# Patient Record
Sex: Female | Born: 1990 | Race: White | Hispanic: No | Marital: Single
Health system: Southern US, Community
[De-identification: ages and names within clinical notes are randomized; demographics above are authoritative.]

## PROBLEM LIST (undated history)

## (undated) DIAGNOSIS — K529 Noninfective gastroenteritis and colitis, unspecified: Secondary | ICD-10-CM

## (undated) DIAGNOSIS — G43909 Migraine, unspecified, not intractable, without status migrainosus: Secondary | ICD-10-CM

---

## 2014-09-08 ENCOUNTER — Emergency Department: Payer: Self-pay | Admitting: Emergency Medicine

## 2014-09-09 ENCOUNTER — Emergency Department: Payer: Self-pay | Admitting: Emergency Medicine

## 2014-11-13 ENCOUNTER — Emergency Department: Admit: 2014-11-13 | Discharge status: Against Medical Advice | Payer: Self-pay | Admitting: Emergency Medicine

## 2014-11-13 LAB — CBC WITH DIFFERENTIAL/PLATELET
BASOS ABS: 0 10*3/uL (ref 0.0–0.1)
BASOS PCT: 0.3 %
Eosinophil #: 0.1 10*3/uL (ref 0.0–0.7)
Eosinophil %: 1.7 %
HCT: 38.2 % (ref 35.0–47.0)
HGB: 12.5 g/dL (ref 12.0–16.0)
LYMPHS PCT: 26.4 %
Lymphocyte #: 2 10*3/uL (ref 1.0–3.6)
MCH: 29.9 pg (ref 26.0–34.0)
MCHC: 32.7 g/dL (ref 32.0–36.0)
MCV: 91 fL (ref 80–100)
MONO ABS: 0.4 x10 3/mm (ref 0.2–0.9)
Monocyte %: 4.9 %
Neutrophil #: 5.1 10*3/uL (ref 1.4–6.5)
Neutrophil %: 66.7 %
PLATELETS: 202 10*3/uL (ref 150–440)
RBC: 4.18 10*6/uL (ref 3.80–5.20)
RDW: 13.7 % (ref 11.5–14.5)
WBC: 7.7 10*3/uL (ref 3.6–11.0)

## 2014-11-13 LAB — URINALYSIS, COMPLETE
BILIRUBIN, UR: NEGATIVE
Bacteria: NONE SEEN
Blood: NEGATIVE
Glucose,UR: NEGATIVE mg/dL (ref 0–75)
LEUKOCYTE ESTERASE: NEGATIVE
Nitrite: NEGATIVE
PROTEIN: NEGATIVE
Ph: 6 (ref 4.5–8.0)
RBC, UR: NONE SEEN /HPF (ref 0–5)
Specific Gravity: 1.015 (ref 1.003–1.030)
Squamous Epithelial: NONE SEEN

## 2014-11-13 LAB — COMPREHENSIVE METABOLIC PANEL
ANION GAP: 11 (ref 7–16)
Albumin: 5.3 g/dL — ABNORMAL HIGH
Alkaline Phosphatase: 50 U/L
BUN: 11 mg/dL
Bilirubin,Total: 1 mg/dL
CALCIUM: 9.4 mg/dL
CO2: 23 mmol/L
Chloride: 104 mmol/L
Creatinine: 0.73 mg/dL
EGFR (African American): >60
EGFR (Non-African Amer.): >60
Glucose: 87 mg/dL
Potassium: 3.8 mmol/L
SGOT(AST): 32 U/L
SGPT (ALT): 15 U/L
SODIUM: 138 mmol/L
Total Protein: 8 g/dL

## 2014-12-14 ENCOUNTER — Emergency Department
Admission: EM | Admit: 2014-12-14 | Discharge: 2014-12-14 | Disposition: A | Discharge status: Home / Self Care | Payer: Managed Care, Other (non HMO) | Attending: Emergency Medicine | Admitting: Emergency Medicine

## 2014-12-14 ENCOUNTER — Encounter: Payer: Self-pay | Admitting: Emergency Medicine

## 2014-12-14 ENCOUNTER — Emergency Department: Payer: Managed Care, Other (non HMO)

## 2014-12-14 DIAGNOSIS — Z3202 Encounter for pregnancy test, result negative: Secondary | ICD-10-CM | POA: Insufficient documentation

## 2014-12-14 DIAGNOSIS — R51 Headache: Secondary | ICD-10-CM

## 2014-12-14 DIAGNOSIS — G43901 Migraine, unspecified, not intractable, with status migrainosus: Secondary | ICD-10-CM | POA: Diagnosis not present

## 2014-12-14 DIAGNOSIS — R519 Headache, unspecified: Secondary | ICD-10-CM

## 2014-12-14 HISTORY — DX: Migraine, unspecified, not intractable, without status migrainosus: G43.909

## 2014-12-14 HISTORY — DX: Noninfective gastroenteritis and colitis, unspecified: K52.9

## 2014-12-14 LAB — POCT PREGNANCY, URINE: Preg Test, Ur: NEGATIVE

## 2014-12-14 MED ORDER — PROMETHAZINE HCL 25 MG PO TABS: 25.0000 mg | ORAL_TABLET | Freq: Three times a day (TID) | ORAL | Status: AC | PRN

## 2014-12-14 MED ORDER — ACETAMINOPHEN 500 MG PO TABS
1000.0000 mg | ORAL_TABLET | ORAL | Status: AC
  Administered 2014-12-14: 1000 mg via ORAL

## 2014-12-14 MED ORDER — ACETAMINOPHEN 500 MG PO TABS
ORAL_TABLET | ORAL | Status: AC
  Administered 2014-12-14: 1000 mg via ORAL
  Filled 2014-12-14: qty 2

## 2014-12-14 NOTE — ED Provider Notes (Signed)
[facility] Emergency Department Provider Note  ____________________________________________  Time seen: Approximately 4:51 AM  I have reviewed the triage vital signs and the nursing notes.   HISTORY  Chief Complaint Headache    HPI Amanda Donovan is a 24 y.o. female who presents with a headache which has been ongoing for about 2 weeks. Patient states she's been having a headache with occasional sensitivity to light that she describes as throbbing and aching that starts at the back of her head and works 24. She does have a history of migraines, and states this feels similar. Tonight however, she woke up at approximately 12:30 in the morning and stated that the headache seemed to be worse noting a sharp sensation across the scalp which resolved after taking 3 of appropriate.  Denies fevers or chills. She does state the muscles on the right side of her neck feel little bit tight, but otherwise well. No numbness or tingling. She had a bizarre episode which she says is difficult to describe where her eyes briefly were darting side to side uncontrollably that lasted about a minute and is now resolved. She feels relatively improved now. She did drive here and wishes to drive home if released.   Past Medical History  Diagnosis Date  . Migraine   . Colitis     There are no active problems to display for this patient.   No past surgical history on file.  No current outpatient prescriptions on file.  Allergies Ortho tri-cyclen  No family history on file.  Social History History  Substance Use Topics  . Smoking status: Not on file  . Smokeless tobacco: Not on file  . Alcohol Use: Not on file   Has a sister who died of an astrocytoma at age 25  Review of Systems Constitutional: No fever/chills Eyes: No visual changes now, but did have a brief series of eye movements. ENT: No sore throat. Cardiovascular: Denies chest pain. Respiratory: Denies shortness of  breath. Gastrointestinal: No abdominal pain.  No nausea, no vomiting.  No diarrhea.  No constipation. Genitourinary: Negative for dysuria. Musculoskeletal: Negative for back pain. Skin: Negative for rash. Neurological: Negative for headaches, focal weakness or numbness.  Denies pregnancy  10-point ROS otherwise negative.  ____________________________________________   PHYSICAL EXAM:  VITAL SIGNS: ED Triage Vitals  Enc Vitals Group     BP 12/14/14 0129 123/78 mmHg     Pulse Rate 12/14/14 0129 81     Resp 12/14/14 0129 20     Temp 12/14/14 0129 98.1 F (36.7 C)     Temp Source 12/14/14 0129 Oral     SpO2 12/14/14 0129 98 %     Weight 12/14/14 0127 110 lb (49.896 kg)     Height 12/14/14 0127 5\' 4"  (1.626 m)     Head Cir --      Peak Flow --      Pain Score 12/14/14 0128 9     Pain Loc --      Pain Edu? --      Excl. in GC? --     Constitutional: Alert and oriented. Well appearing and in no acute distress. Eyes: Conjunctivae are normal. PERRL. EOMI. Head: Atraumatic. Nose: No congestion/rhinnorhea. Mouth/Throat: Mucous membranes are moist.  Oropharynx non-erythematous. Neck: No stridor.   Cardiovascular: Normal rate, regular rhythm. Grossly normal heart sounds.  Good peripheral circulation. Respiratory: Normal respiratory effort.  No retractions. Lungs CTAB. Gastrointestinal: Soft and nontender. No distention. No abdominal bruits. No CVA tenderness.  Musculoskeletal: No lower extremity tenderness nor edema.  No joint effusions. Neurologic:  Normal speech and language. No gross focal neurologic deficits are appreciated. Cranial nerves II through XII are intact. 5 out of 5 strength in all extremities. Normal sensation in all extremities. Extraocular movements and visual fields are intact. Speech is normal. No gait instability. There is no meningismus. Patient does have mild photophobia. Skin:  Skin is warm, dry and intact. No rash noted. Psychiatric: Mood and affect are  normal. Speech and behavior are normal.  ____________________________________________   LABS (all labs ordered are listed, but only abnormal results are displayed)  Labs Reviewed - No data to display ____________________________________________  EKG   ____________________________________________  RADIOLOGY  Normal CT head ____________________________________________   PROCEDURES  Procedure(s) performed: None  Critical Care performed: No  ____________________________________________   INITIAL IMPRESSION / ASSESSMENT AND PLAN / ED COURSE  Pertinent labs & imaging results that were available during my care of the patient were reviewed by me and considered in my medical decision making (see chart for details).  Persistent headache for approximately 12 days with some worsening tonight which improved with ibuprofen. Although the patient does carry a history of migraines, she does notably have a sister who had a astrocytoma. Her neurologic exam is completely intact at this time, and I don't have a great explanation for her strange eye movements, but I am reassured that this is improved now. There is no other neurologic symptoms to suggest multiple sclerosis or other acute neurologic deficit. Certainly no evidence of stroke. Discussed with the patient treatment for possible status migrainous, however she wishes to be able to drive home which limits medication choices in the ER area patient is quite understanding with this.  Discussed with the patient Rocephin and benefits of CT, we will obtain a CT head to evaluate for gross defect. If normal, patient does have a primary care physician and I advised if her headache continues to persist she should see her primary on Tuesday and have consideration for further evaluation including MRI. ____________________________________________   Patient reports mild improvement. She is awake and alert in no distress. I will prescribe the patient  brief course of Phenergan which she will use at home, but not while driving. We did discuss side effects including drowsiness. Patient will follow-up with her primary care physician this week for further evaluation.  Return precautions discussed including any numbness or tingling, double vision, severe worsening, vomiting, or fever.  FINAL CLINICAL IMPRESSION(S) / ED DIAGNOSES  Status migrainous    Sharyn Creamer, MD 12/14/14 [phone]

## 2014-12-14 NOTE — Discharge Instructions (Signed)

## 2014-12-14 NOTE — ED Notes (Addendum)
Pt presents to ER alert and in NAD. Pt states she has had a h/a for 12 days. Pt talking on cell phone when called for triage. Pt in NAD. Pt states tonight she woke up and couldn't control eye movement. Pt moving eyes normally in triage. Pt is barefoot.

## 2014-12-14 NOTE — ED Notes (Signed)
Pt presents to ER alert and in NAD. Pt states she has had a h/a for 12 days. Pt talking on cell phone when called for triage. Pt in NAD.

## 2015-01-20 ENCOUNTER — Emergency Department: Payer: Managed Care, Other (non HMO)

## 2015-01-20 ENCOUNTER — Emergency Department
Admission: EM | Admit: 2015-01-20 | Discharge: 2015-01-20 | Disposition: A | Discharge status: Home / Self Care | Payer: Managed Care, Other (non HMO) | Attending: Emergency Medicine | Admitting: Emergency Medicine

## 2015-01-20 DIAGNOSIS — R1013 Epigastric pain: Secondary | ICD-10-CM | POA: Insufficient documentation

## 2015-01-20 DIAGNOSIS — Z3202 Encounter for pregnancy test, result negative: Secondary | ICD-10-CM | POA: Diagnosis not present

## 2015-01-20 LAB — CBC WITH DIFFERENTIAL/PLATELET
BASOS PCT: 2 %
Basophils Absolute: 0.1 10*3/uL (ref 0–0.1)
Eosinophils Absolute: 0.4 10*3/uL (ref 0–0.7)
Eosinophils Relative: 5 %
HCT: 40.9 % (ref 35.0–47.0)
HEMOGLOBIN: 13.9 g/dL (ref 12.0–16.0)
LYMPHS PCT: 31 %
Lymphs Abs: 2.9 10*3/uL (ref 1.0–3.6)
MCH: 30.1 pg (ref 26.0–34.0)
MCHC: 34 g/dL (ref 32.0–36.0)
MCV: 88.7 fL (ref 80.0–100.0)
MONOS PCT: 9 %
Monocytes Absolute: 0.9 10*3/uL (ref 0.2–0.9)
NEUTROS ABS: 5.1 10*3/uL (ref 1.4–6.5)
Neutrophils Relative %: 53 %
Platelets: 226 10*3/uL (ref 150–440)
RBC: 4.61 MIL/uL (ref 3.80–5.20)
RDW: 13.4 % (ref 11.5–14.5)
WBC: 9.4 10*3/uL (ref 3.6–11.0)

## 2015-01-20 LAB — COMPREHENSIVE METABOLIC PANEL
ALBUMIN: 5.1 g/dL — AB (ref 3.5–5.0)
ALT: 15 U/L (ref 14–54)
AST: 24 U/L (ref 15–41)
Alkaline Phosphatase: 53 U/L (ref 38–126)
Anion gap: 11 (ref 5–15)
BUN: 13 mg/dL (ref 6–20)
CALCIUM: 9.4 mg/dL (ref 8.9–10.3)
CO2: 23 mmol/L (ref 22–32)
Chloride: 102 mmol/L (ref 101–111)
Creatinine, Ser: 0.73 mg/dL (ref 0.44–1.00)
GFR calc Af Amer: >60 mL/min (ref >60)
GFR calc non Af Amer: >60 mL/min (ref >60)
Glucose, Bld: 100 mg/dL — ABNORMAL HIGH (ref 65–99)
POTASSIUM: 3.5 mmol/L (ref 3.5–5.1)
SODIUM: 136 mmol/L (ref 135–145)
TOTAL PROTEIN: 8.1 g/dL (ref 6.5–8.1)
Total Bilirubin: 0.5 mg/dL (ref 0.3–1.2)

## 2015-01-20 LAB — URINALYSIS COMPLETE WITH MICROSCOPIC (ARMC ONLY)
BACTERIA UA: NONE SEEN
BILIRUBIN URINE: NEGATIVE
Glucose, UA: NEGATIVE mg/dL
HGB URINE DIPSTICK: NEGATIVE
Ketones, ur: NEGATIVE mg/dL
Leukocytes, UA: NEGATIVE
NITRITE: NEGATIVE
PH: 6 (ref 5.0–8.0)
PROTEIN: NEGATIVE mg/dL
RBC / HPF: NONE SEEN RBC/hpf (ref 0–5)
Specific Gravity, Urine: 1.008 (ref 1.005–1.030)

## 2015-01-20 LAB — AMYLASE: AMYLASE: 53 U/L (ref 28–100)

## 2015-01-20 LAB — POCT PREGNANCY, URINE: PREG TEST UR: NEGATIVE

## 2015-01-20 LAB — LIPASE, BLOOD: Lipase: 37 U/L (ref 22–51)

## 2015-01-20 LAB — TROPONIN I: Troponin I: <0.03 ng/mL (ref <0.031)

## 2015-01-20 MED ORDER — GI COCKTAIL ~~LOC~~
30.0000 mL | Freq: Once | ORAL | Status: AC
  Administered 2015-01-20: 30 mL via ORAL

## 2015-01-20 MED ORDER — ONDANSETRON HCL 4 MG/2ML IJ SOLN
4.0000 mg | Freq: Once | INTRAMUSCULAR | Status: AC
  Administered 2015-01-20: 4 mg via INTRAVENOUS

## 2015-01-20 MED ORDER — IOHEXOL 240 MG/ML SOLN
25.0000 mL | INTRAMUSCULAR | Status: AC
  Administered 2015-01-20: 25 mL via ORAL

## 2015-01-20 MED ORDER — MORPHINE SULFATE 4 MG/ML IJ SOLN
4.0000 mg | Freq: Once | INTRAMUSCULAR | Status: AC
Start: 2015-01-20 — End: 2015-01-20
  Administered 2015-01-20: 4 mg via INTRAVENOUS

## 2015-01-20 MED ORDER — IOHEXOL 300 MG/ML  SOLN
80.0000 mL | Freq: Once | INTRAMUSCULAR | Status: AC | PRN
  Administered 2015-01-20: 80 mL via INTRAVENOUS

## 2015-01-20 MED ORDER — GI COCKTAIL ~~LOC~~
ORAL | Status: AC
  Filled 2015-01-20: qty 30

## 2015-01-20 MED ORDER — IOHEXOL 300 MG/ML  SOLN: 100.0000 mL | Freq: Once | INTRAMUSCULAR | Status: DC | PRN

## 2015-01-20 MED ORDER — MORPHINE SULFATE 4 MG/ML IJ SOLN
INTRAMUSCULAR | Status: AC
  Administered 2015-01-20: 4 mg via INTRAVENOUS
  Filled 2015-01-20: qty 1

## 2015-01-20 MED ORDER — ONDANSETRON HCL 4 MG/2ML IJ SOLN
INTRAMUSCULAR | Status: AC
  Administered 2015-01-20: 4 mg via INTRAVENOUS
  Filled 2015-01-20: qty 2

## 2015-01-20 NOTE — Discharge Instructions (Signed)
You have been seen in the Emergency Department (ED) for abdominal pain.  Your evaluation did not identify a clear cause of your symptoms but was generally reassuring. ° °Please follow up as instructed above regarding today’s emergent visit and the symptoms that are bothering you. ° °Return to the ED if your abdominal pain worsens or fails to improve, you develop bloody vomiting, bloody diarrhea, you are unable to tolerate fluids due to vomiting, fever greater than 101, or other symptoms that concern you. ° ° °Abdominal Pain °Many things can cause abdominal pain. Usually, abdominal pain is not caused by a disease and will improve without treatment. It can often be observed and treated at home. Your health care provider will do a physical exam and possibly order blood tests and X-rays to help determine the seriousness of your pain. However, in many cases, more time must pass before a clear cause of the pain can be found. Before that point, your health care provider may not know if you need more testing or further treatment. °HOME CARE INSTRUCTIONS  °Monitor your abdominal pain for any changes. The following actions may help to alleviate any discomfort you are experiencing: °· Only take over-the-counter or prescription medicines as directed by your health care provider. °· Do not take laxatives unless directed to do so by your health care provider. °· Try a clear liquid diet (broth, tea, or water) as directed by your health care provider. Slowly move to a bland diet as tolerated. °SEEK MEDICAL CARE IF: °· You have unexplained abdominal pain. °· You have abdominal pain associated with nausea or diarrhea. °· You have pain when you urinate or have a bowel movement. °· You experience abdominal pain that wakes you in the night. °· You have abdominal pain that is worsened or improved by eating food. °· You have abdominal pain that is worsened with eating fatty foods. °· You have a fever. °SEEK IMMEDIATE MEDICAL CARE IF:   °· Your pain does not go away within 2 hours. °· You keep throwing up (vomiting). °· Your pain is felt only in portions of the abdomen, such as the right side or the left lower portion of the abdomen. °· You pass bloody or black tarry stools. °MAKE SURE YOU: °· Understand these instructions.   °· Will watch your condition.   °· Will get help right away if you are not doing well or get worse.   °Document Released: 04/14/2005 Document Revised: 07/10/2013 Document Reviewed: 03/14/2013 °[employer]® Patient Information ©2015 [employer]. This information is not intended to replace advice given to you by your health care provider. Make sure you discuss any questions you have with your health care provider. ° °

## 2015-01-20 NOTE — ED Notes (Signed)
Patient transported to CT 

## 2015-01-20 NOTE — ED Notes (Signed)
Patient transported to X-ray 

## 2015-01-20 NOTE — ED Provider Notes (Signed)
[facility] Emergency Department Provider Note  ____________________________________________  Time seen: Approximately 505 AM  I have reviewed the triage vital signs and the nursing notes.   HISTORY  Chief Complaint Abdominal Pain    HPI Amanda Donovan is a 24 y.o. female who comes in with abdominal pain. The patient reports that she went to her regular doctor because she was having abdominal pain and feeling "acidy"and was sent to the emergency department. The patient reports that [facility] Saturday, July [facility] for evaluation. She reports that she received some blood work and an ultrasound to check her gallbladder. She also reports that she received a rectal exam. The patient reports that she was prescribed an acid reducer and sucralfate. The patient reports that she's taken one dose of each but it has not helped her symptoms. She reports that the pain seemed to get worse after work and has spread to her back her legs and her arms. The patient feels achy all over and reports that her pain is an 8 out of 10 in intensity. The patient reports that since her visit yesterday she has drank smoothies had frequent and had a half of a [place] sandwich. The patient reports that she has never had anything like this in the past and felt as though her stomach was pulsating. The patient has not had any chest pain no vomiting but has had some nausea and no pain with urination. She reports that she felt that she may have some kidney infection that could also be causing the symptoms. The patient came in because she was unable to tolerate the pain.   Past Medical History  Diagnosis Date  . Migraine   . Colitis     There are no active problems to display for this patient.   No past surgical history on file.  Current Outpatient Rx  Name  Route  Sig  Dispense  Refill  . promethazine (PHENERGAN) 25 MG tablet   Oral   Take 1 tablet (25 mg total) by mouth every 8  (eight) hours as needed for nausea or vomiting.   30 tablet   0     Allergies Ortho tri-cyclen Trazodone  No family history on file.  Social History History  Substance Use Topics  . Smoking status: Not on file  . Smokeless tobacco: Not on file  . Alcohol Use: Not on file    Review of Systems Constitutional: No fever/chills Eyes: No visual changes. ENT: No sore throat. Cardiovascular:  chest pain heaviness Respiratory: Denies shortness of breath. Gastrointestinal: abdominal pain and nausea with no vomiting.  No diarrhea.  No constipation. Genitourinary: Negative for dysuria. Musculoskeletal: back pain and muscle aches Skin: Negative for rash. Neurological: Negative for headaches  10-point ROS otherwise negative.  ____________________________________________   PHYSICAL EXAM:  VITAL SIGNS: ED Triage Vitals  Enc Vitals Group     BP 01/20/15 0110 120/82 mmHg     Pulse Rate 01/20/15 0110 103     Resp 01/20/15 0110 18     Temp 01/20/15 0110 98.3 F (36.8 C)     Temp Source 01/20/15 0110 Oral     SpO2 01/20/15 0110 100 %     Weight 01/20/15 0110 110 lb (49.896 kg)     Height 01/20/15 0110 5\' 4"  (1.626 m)     Head Cir --      Peak Flow --      Pain Score 01/20/15 0105 8  Pain Loc --      Pain Edu? --      Excl. in GC? --     Constitutional: Alert and oriented. Well appearing and in moderate distress. Eyes: Conjunctivae are normal. PERRL. EOMI. Head: Atraumatic. Nose: No congestion/rhinnorhea. Mouth/Throat: Mucous membranes are moist.  Oropharynx non-erythematous. Cardiovascular: Normal rate, regular rhythm. Grossly normal heart sounds.  Good peripheral circulation. Respiratory: Normal respiratory effort.  No retractions. Lungs CTAB. Gastrointestinal: Soft and epigastric and left upper quadrant tenderness to palpation No distention. No abdominal bruits. CVA tenderness Bilaterally Genitourinary: Deferred Musculoskeletal: No lower extremity tenderness nor  edema.   Neurologic:  Normal speech and language. No gross focal neurologic deficits are appreciated.  Skin:  Skin is warm, dry and intact. No rash noted. Psychiatric: Mood and affect are normal.   ____________________________________________   LABS (all labs ordered are listed, but only abnormal results are displayed)  Labs Reviewed  COMPREHENSIVE METABOLIC PANEL - Abnormal; Notable for the following:    Glucose, Bld 100 (*)    Albumin 5.1 (*)    All other components within normal limits  URINALYSIS COMPLETEWITH MICROSCOPIC ([facility] ONLY) - Abnormal; Notable for the following:    Color, Urine STRAW (*)    APPearance CLEAR (*)    Squamous Epithelial / LPF 0-5 (*)    All other components within normal limits  AMYLASE  CBC WITH DIFFERENTIAL/PLATELET  LIPASE, BLOOD  TROPONIN I  POC URINE PREG, ED  POCT PREGNANCY, URINE   ____________________________________________  EKG   ____________________________________________  RADIOLOGY  Chest x-ray: Negative frontal chest ____________________________________________   PROCEDURES  Procedure(s) performed: None  Critical Care performed: No  ____________________________________________   INITIAL IMPRESSION / ASSESSMENT AND PLAN / ED COURSE  Pertinent labs & imaging results that were available during my care of the patient were reviewed by me and considered in my medical decision making (see chart for details).  This is a 24 year old female who comes in with upper abdominal pain times multiple days. The patient reports that she is unable to tolerate the pain. I will give the patient GI cocktail and reassess the patient for possible CT scan.  The patient's Okey Regal be signed out to Dr. York Cerise who will follow-up the results of the CT scan ____________________________________________   FINAL CLINICAL IMPRESSION(S) / ED DIAGNOSES  Final diagnoses:  None      Rebecka Apley, MD 01/20/15 [phone]

## 2015-01-20 NOTE — ED Provider Notes (Signed)
-----------------------------------------   7:30 AM on 01/20/2015 -----------------------------------------  Assuming care from Dr. Zenda AlpersWebster.  In short, Amanda Donovan is a 24 y.o. female with a chief complaint of Abdominal Pain .  Refer to the original H&P for additional details.  The current plan of care is to follow-up the CT scan results and reassess.  ----------------------------------------- 10:40 AM on 01/20/2015 -----------------------------------------  CT scan was unremarkable.  The patient's vitals are stable and she is in no acute distress.  I discussed the findings with her and she is still able to point to the tenderness in her left upper quadrant but states that she understands the CT scan was normal and she will follow-up with her gastroenterologist tomorrow.  I gave my usual customary return precautions.  Amanda Roseory Roshanda Balazs, MD 01/20/15 1040

## 2015-01-20 NOTE — ED Notes (Signed)
Report to anna, rn

## 2015-01-20 NOTE — ED Notes (Signed)
Pt ambulatory to triage with no difficulty. Pt reports [facility] and given Rx for Carafate and told she possible has an ulcer. Today pt reports pain is worse and she sees a mass in her abd when she lays down. Pain radiates into her arms, back and legs.

## 2015-01-20 NOTE — ED Notes (Addendum)
Pt states has left upper and lower quadrant pain that radiates to left flank and left mid back. Pt states now pain radiates to bilateral legs and arms. Pt states "it's like a whole body ache." pt states has had nausea and at times "it feels like someone is sitting on my chest." pt denies diarrhea. Pt describes abd pain and back pain as sharp.

## 2016-08-31 IMAGING — CT CT HEAD W/O CM
1 series · 16 of 30 positions shown, 20 images · non-contrast
Comparison: None.

CLINICAL DATA: Headache

EXAM:
CT HEAD WITHOUT CONTRAST
TECHNIQUE: Contiguous axial images were obtained from the base of the skull
through the vertex without intravenous contrast.

[Series 2: head wo · axial · 0.38mm/px · z∈[-168,-42]mm · 16 of 32 slices shown, 20 images]
[im 2/32  brain]
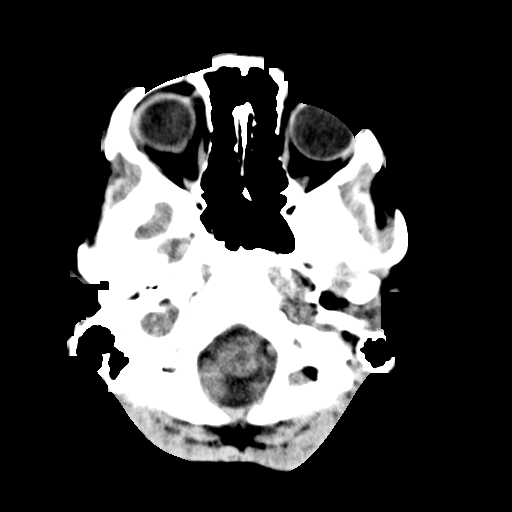
[im 2/32  bone]
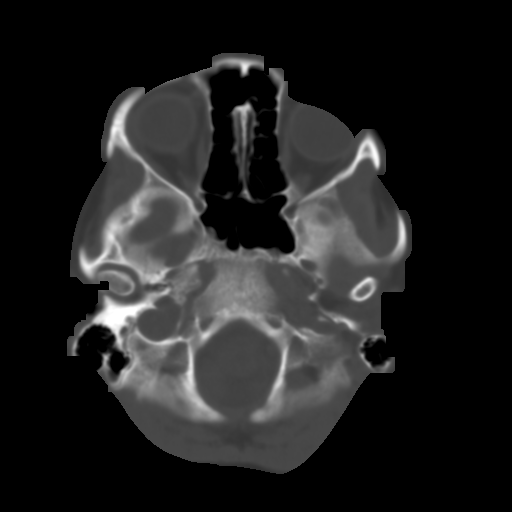
[im 4/32  brain]
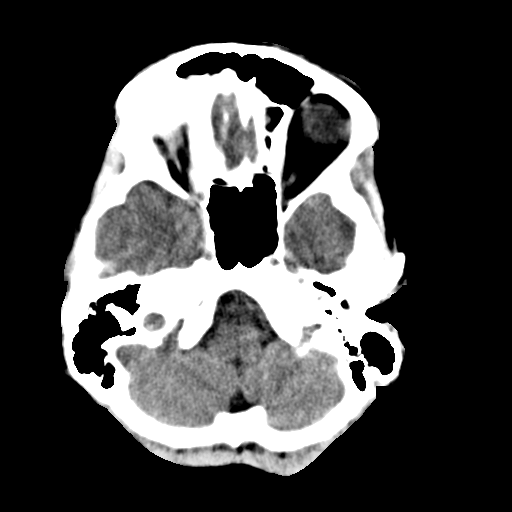
[im 6/32  brain]
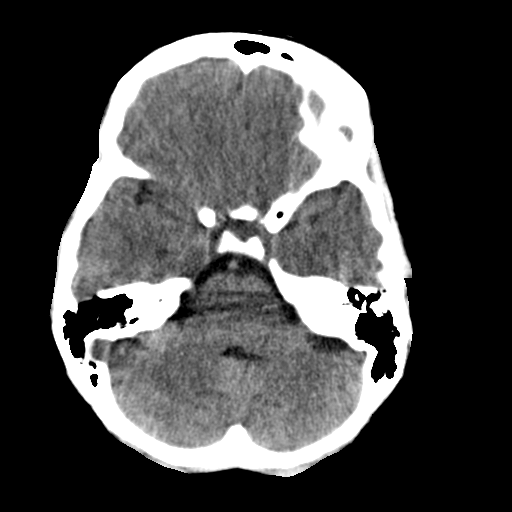
[im 8/32  brain]
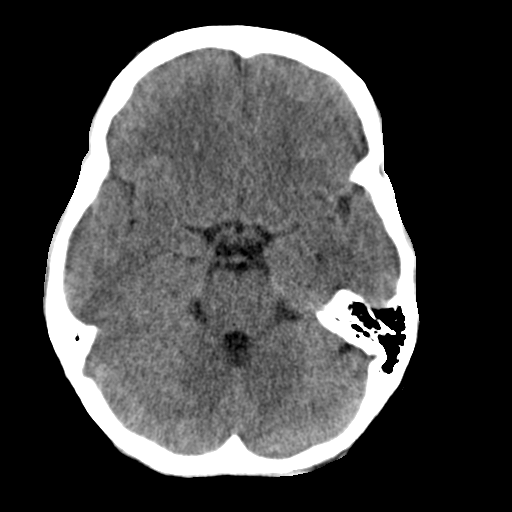
[im 9/32  brain]
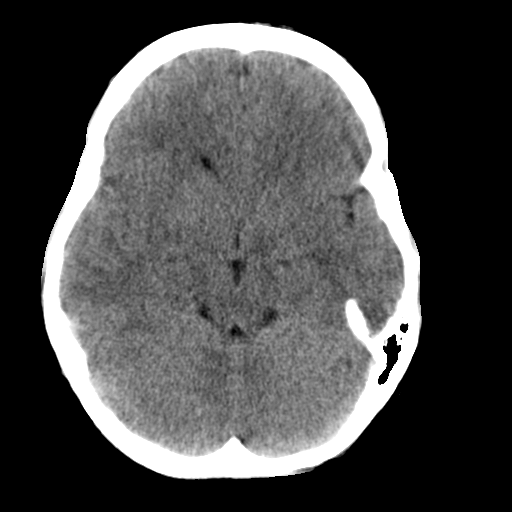
[im 9/32  bone]
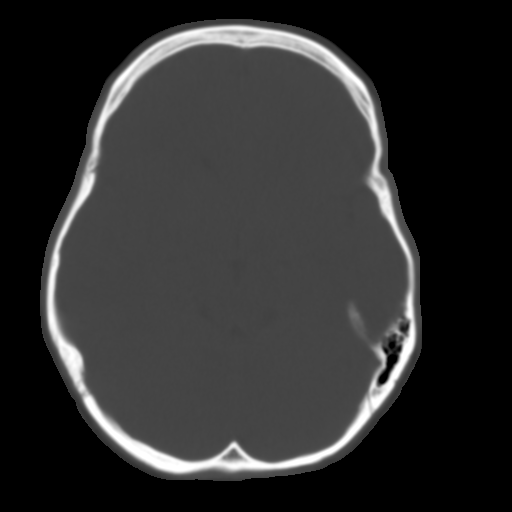
[im 11/32  brain]
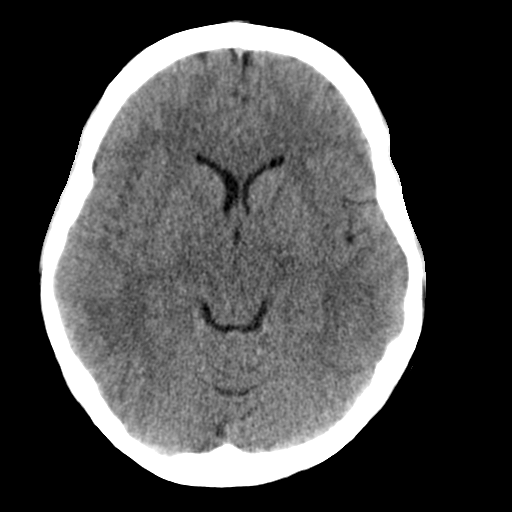
[im 13/32  brain]
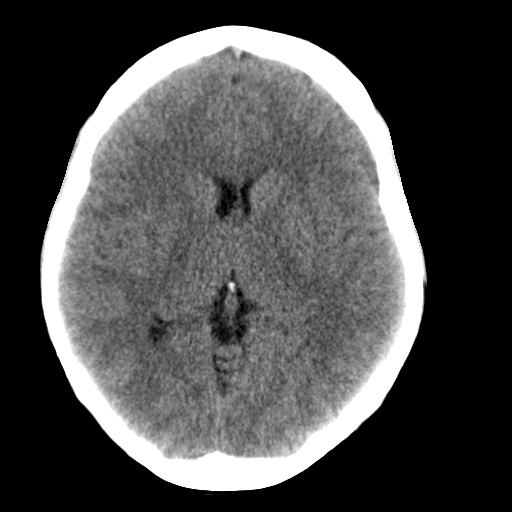
[im 15/32  brain]
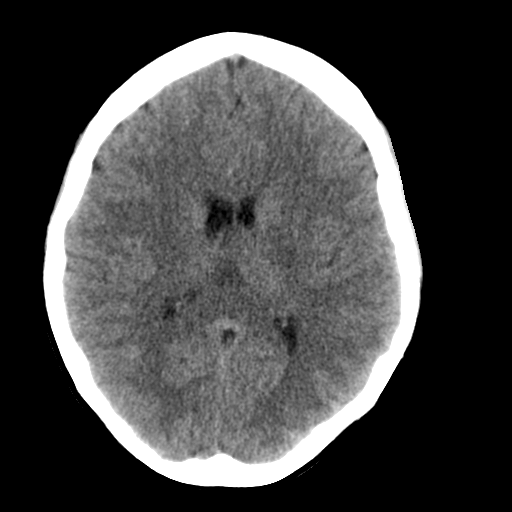
[im 17/32  brain]
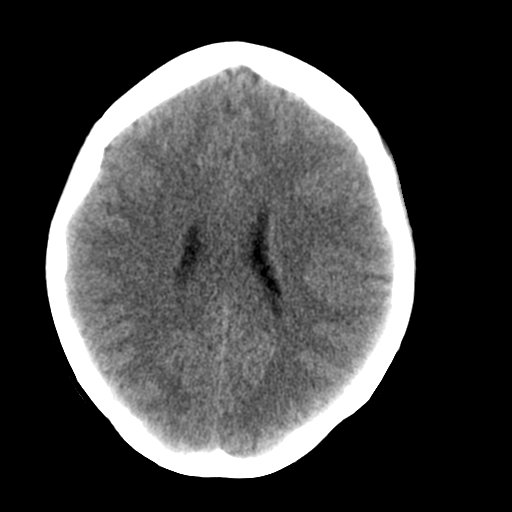
[im 17/32  bone]
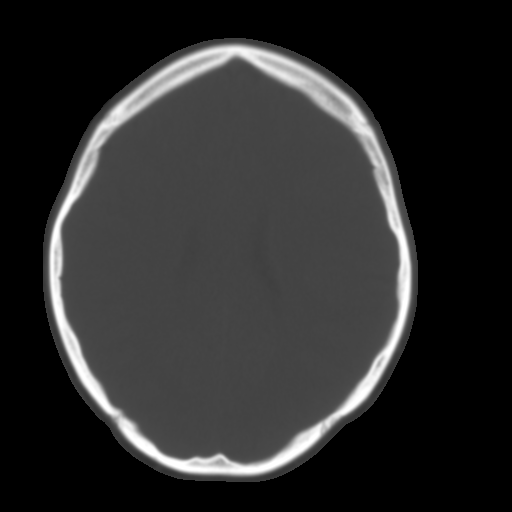
[im 19/32  brain]
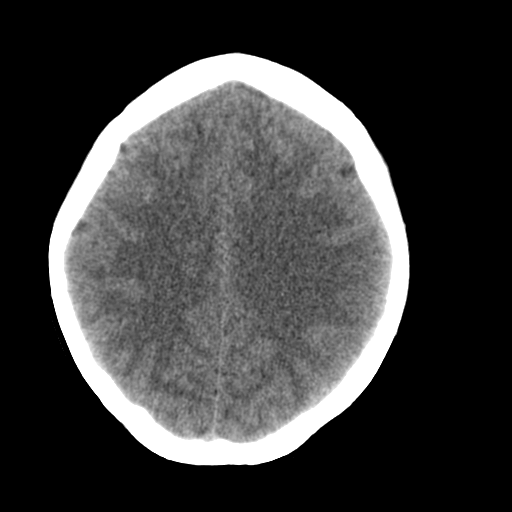
[im 21/32  brain]
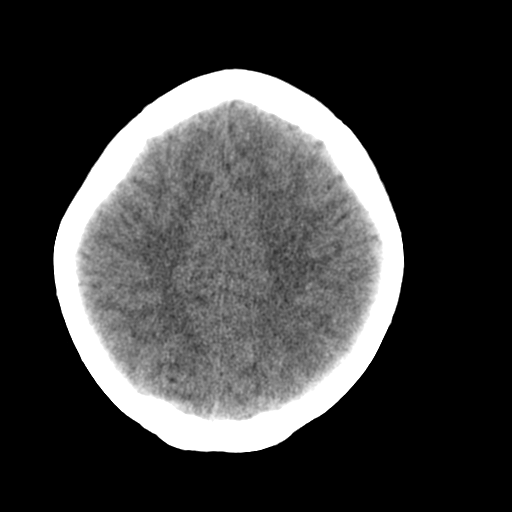
[im 23/32  brain]
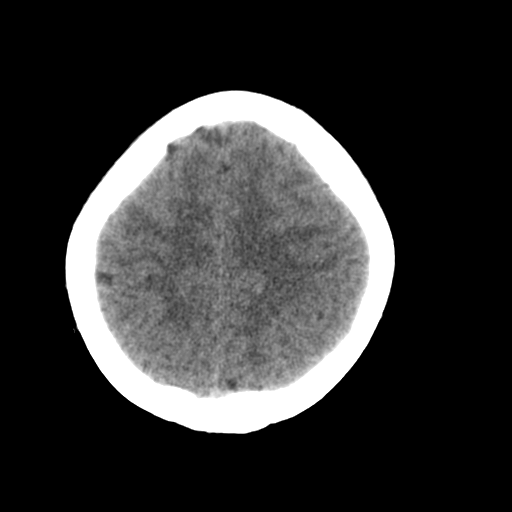
[im 24/32  brain]
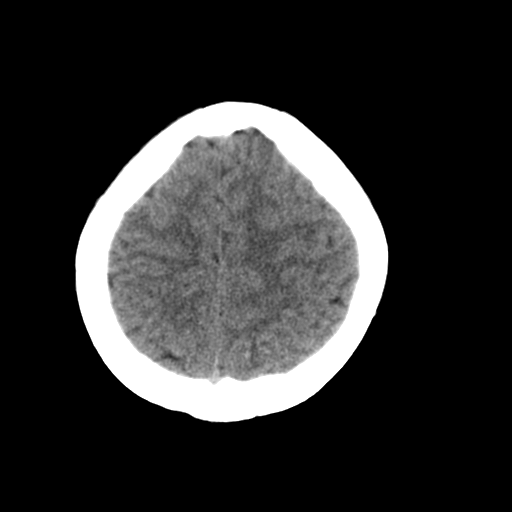
[im 24/32  bone]
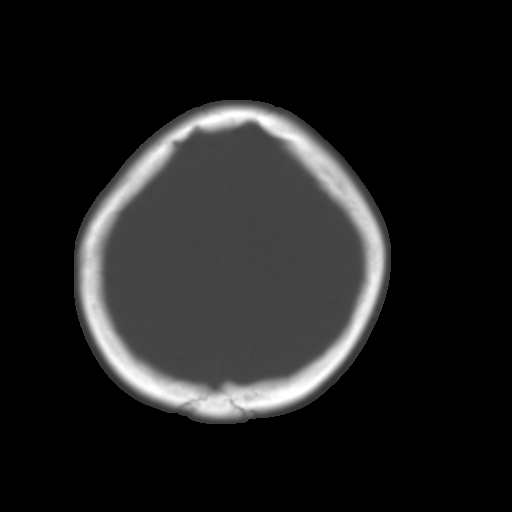
[im 26/32  brain]
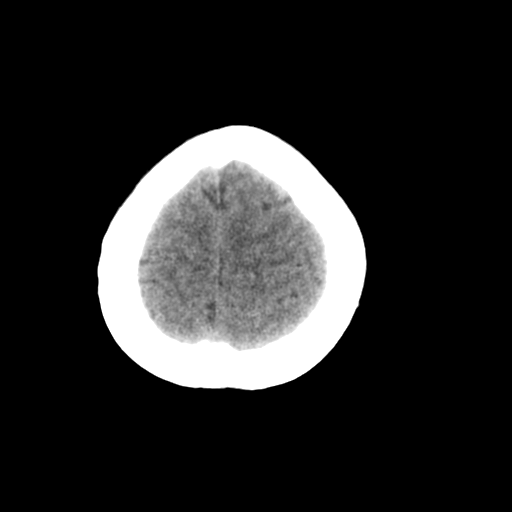
[im 28/32  brain]
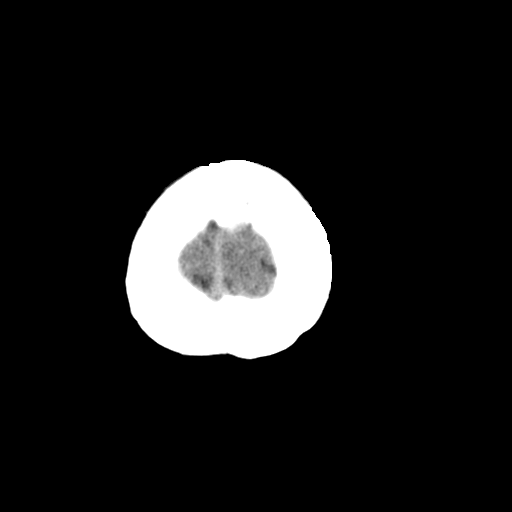
[im 30/32  brain]
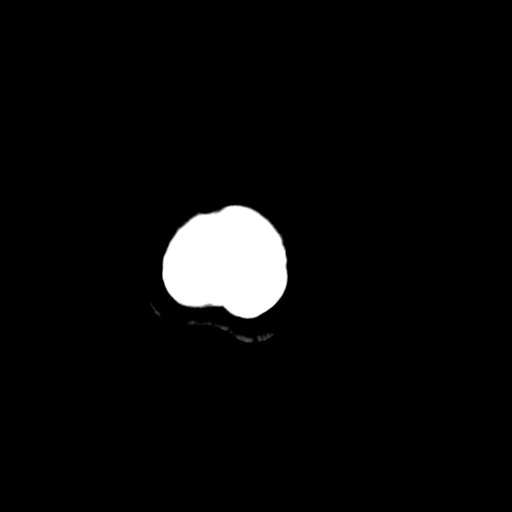

[16 of 30 positions shown; findings below may reference images not displayed]

FINDINGS: Skull and Sinuses:Negative for fracture or destructive process. The
mastoids, middle ears, and imaged paranasal sinuses are clear.

Orbits: No acute abnormality.

Brain: No evidence of acute infarction, hemorrhage, hydrocephalus,
or mass lesion/mass effect.
IMPRESSION: Normal head CT.

## 2016-10-07 IMAGING — CR DG CHEST 1V
1 series · 1 of 1 positions shown · non-contrast
Comparison: 09/09/2014

CLINICAL DATA: Epigastric pain.

EXAM:
CHEST  1 VIEW

[chest pa]
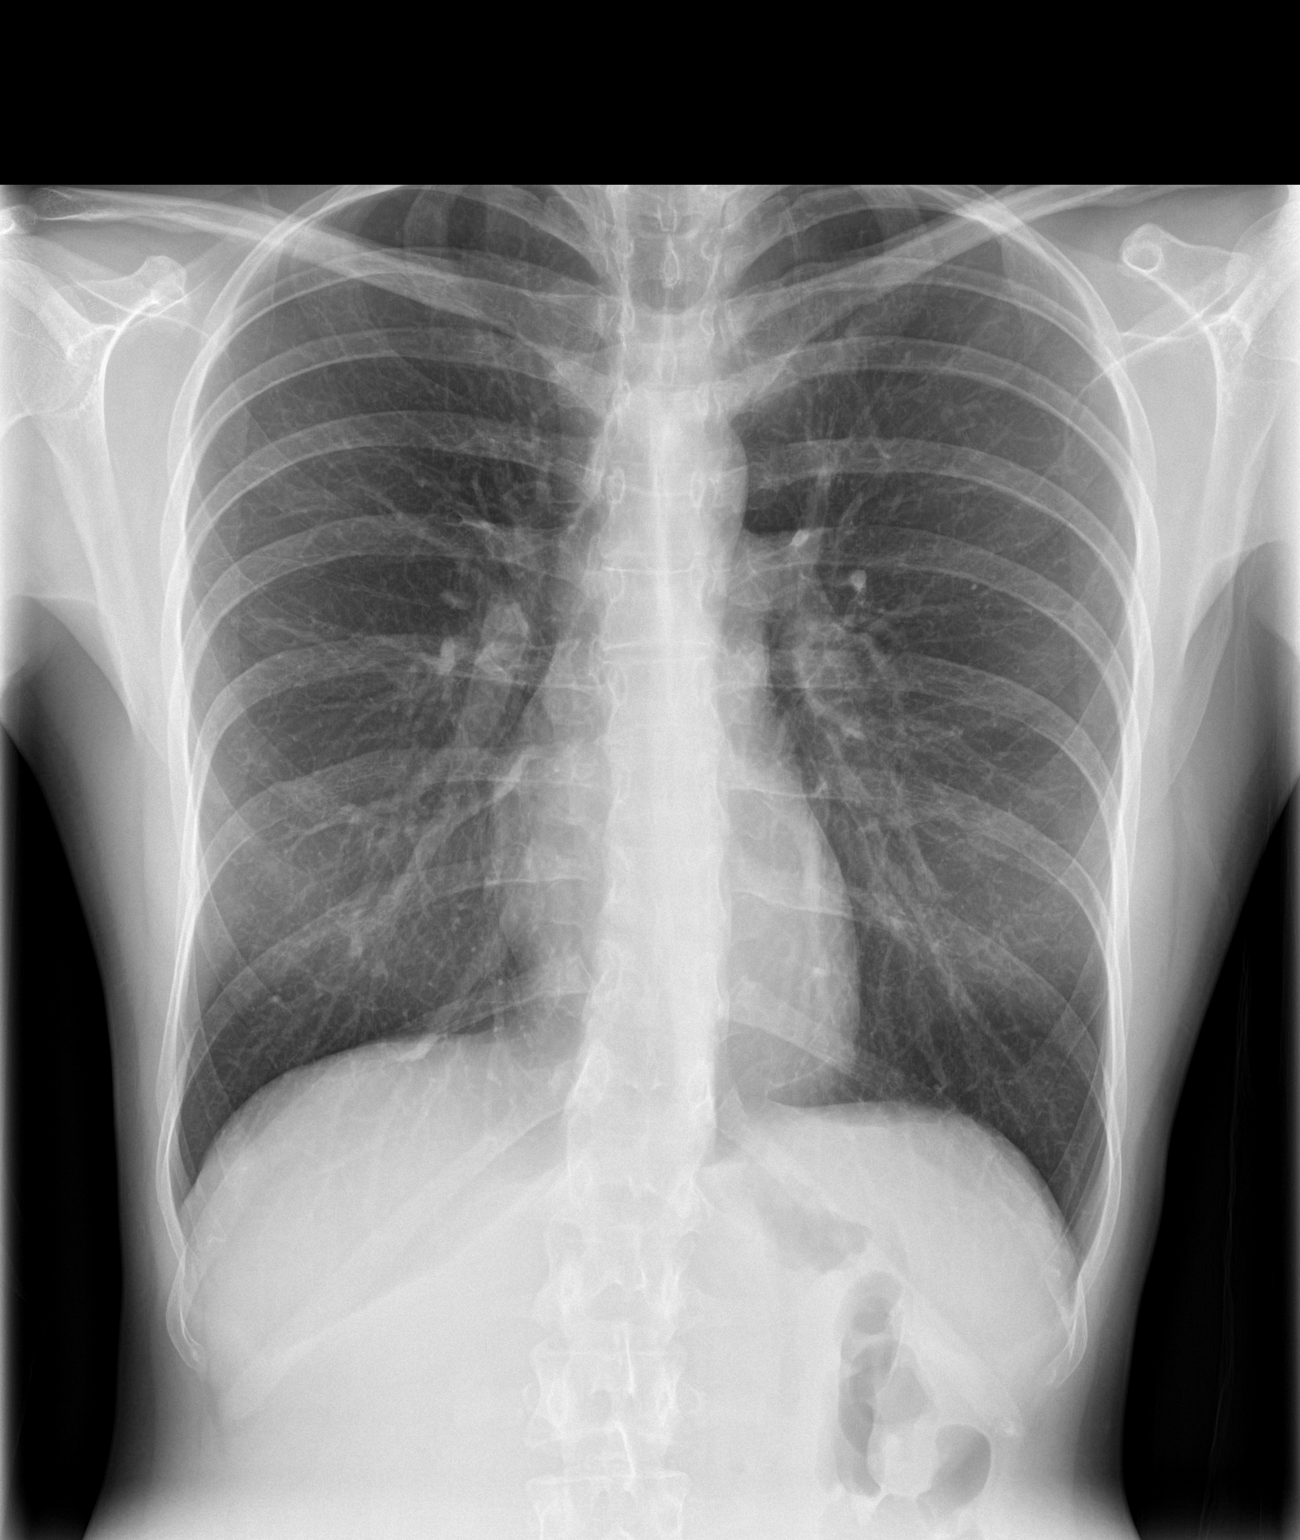

[1 of 1 positions shown; findings below may reference images not displayed]

FINDINGS: Normal heart size and mediastinal contours. No acute infiltrate or
edema. No effusion or pneumothorax. No acute osseous findings.
IMPRESSION: Negative frontal chest.
